# Patient Record
Sex: Female | Born: 1990 | Race: Black or African American | Hispanic: No | Marital: Single | Smoking: Never smoker
Health system: Southern US, Community
[De-identification: ages and names within clinical notes are randomized; demographics above are authoritative.]

---
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
## 2012-04-08 ENCOUNTER — Ambulatory Visit: Payer: Self-pay | Admitting: Family Medicine

## 2012-04-09 ENCOUNTER — Ambulatory Visit: Payer: Self-pay | Admitting: Family Medicine

## 2012-08-05 ENCOUNTER — Ambulatory Visit: Payer: Self-pay | Admitting: Family Medicine

## 2013-04-10 ENCOUNTER — Ambulatory Visit: Payer: Self-pay | Admitting: Internal Medicine

## 2013-04-11 ENCOUNTER — Ambulatory Visit: Payer: Self-pay | Admitting: Family Medicine

## 2013-08-01 ENCOUNTER — Ambulatory Visit: Payer: Self-pay | Admitting: Family Medicine

## 2013-10-03 ENCOUNTER — Ambulatory Visit: Payer: Self-pay | Admitting: Family Medicine

## 2013-10-25 ENCOUNTER — Ambulatory Visit: Payer: Self-pay | Admitting: Family Medicine

## 2014-04-03 ENCOUNTER — Ambulatory Visit: Payer: Self-pay | Admitting: Family Medicine

## 2014-07-10 ENCOUNTER — Ambulatory Visit: Payer: Self-pay | Admitting: Family Medicine

## 2015-03-22 DIAGNOSIS — N946 Dysmenorrhea, unspecified: Secondary | ICD-10-CM | POA: Diagnosis not present

## 2015-07-05 IMAGING — CR RIGHT THUMB 2+V
1 series · 3 of 3 positions shown · non-contrast
Comparison: None.

CLINICAL DATA: Injured right thumb in basketball practice 1 week
ago. Pain and swelling .

EXAM:
RIGHT THUMB 2+V

[Series 1: kdxr thumb right hand (1st digit · 0.14mm/px · 3 of 3 slices shown]
[im 1/3]
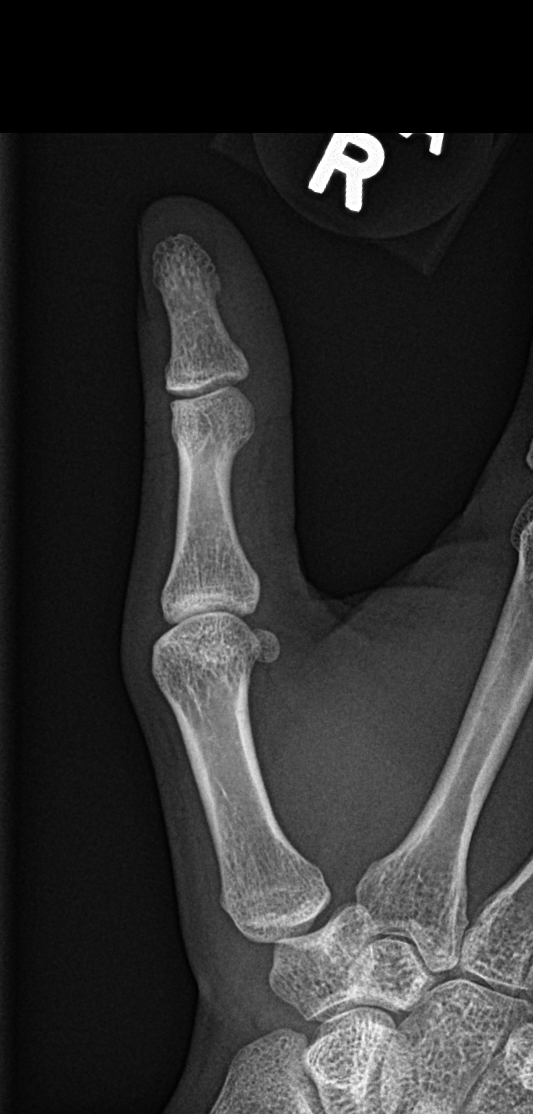
[im 2/3]
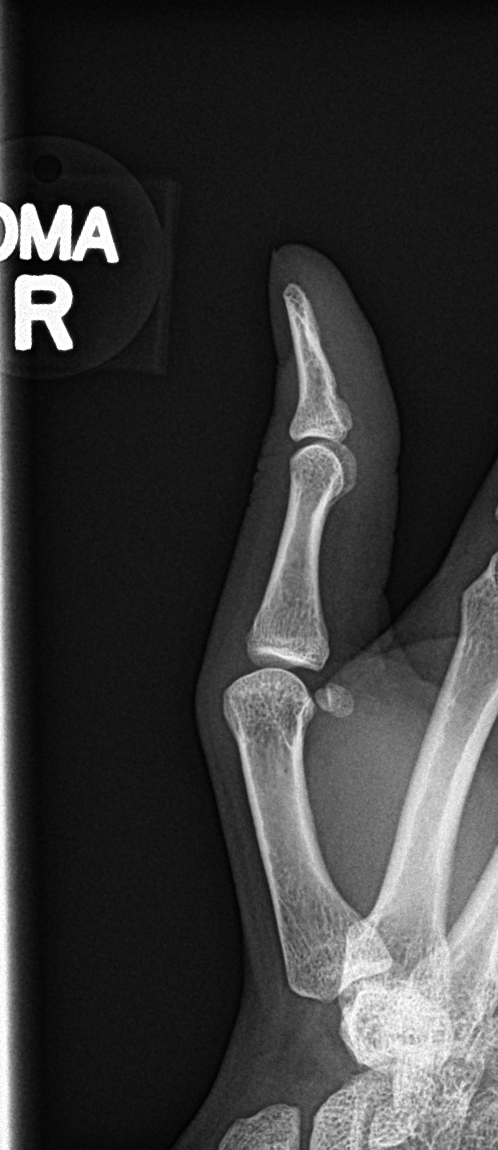
[im 3/3]
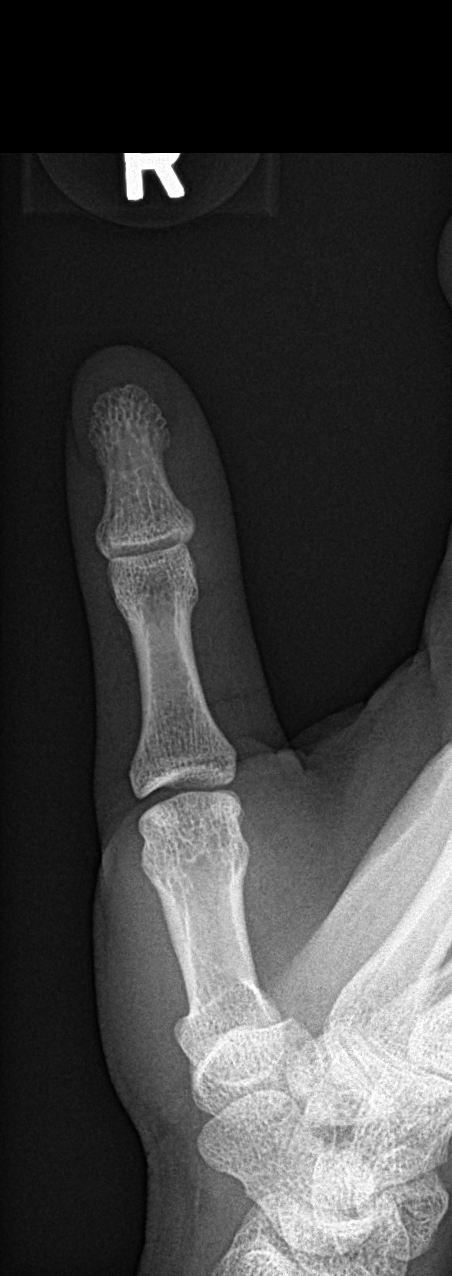

[3 of 3 positions shown; findings below may reference images not displayed]

FINDINGS: No acute bony or joint abnormality. No evidence of fracture or
dislocation.
IMPRESSION: No acute abnormality.

## 2021-04-29 DIAGNOSIS — Z3041 Encounter for surveillance of contraceptive pills: Secondary | ICD-10-CM | POA: Diagnosis not present

## 2021-04-29 DIAGNOSIS — J309 Allergic rhinitis, unspecified: Secondary | ICD-10-CM | POA: Insufficient documentation

## 2021-07-15 DIAGNOSIS — Z13220 Encounter for screening for lipoid disorders: Secondary | ICD-10-CM | POA: Diagnosis not present

## 2021-07-15 DIAGNOSIS — Z0000 Encounter for general adult medical examination without abnormal findings: Secondary | ICD-10-CM | POA: Diagnosis not present

## 2021-07-15 DIAGNOSIS — Z113 Encounter for screening for infections with a predominantly sexual mode of transmission: Secondary | ICD-10-CM | POA: Diagnosis not present

## 2021-07-15 DIAGNOSIS — Z130 Encounter for screening for diseases of the blood and blood-forming organs and certain disorders involving the immune mechanism: Secondary | ICD-10-CM | POA: Diagnosis not present

## 2021-07-15 DIAGNOSIS — Z131 Encounter for screening for diabetes mellitus: Secondary | ICD-10-CM | POA: Diagnosis not present

## 2021-07-15 LAB — HIV ANTIBODY (ROUTINE TESTING W REFLEX): HIV 1&2 Ab, 4th Generation: NEGATIVE

## 2021-07-15 LAB — HM PAP SMEAR: HM Pap smear: NEGATIVE

## 2021-07-15 LAB — HM HEPATITIS C SCREENING LAB: HM Hepatitis Screen: NEGATIVE

## 2021-09-20 ENCOUNTER — Ambulatory Visit: Payer: Self-pay | Admitting: Nurse Practitioner

## 2021-09-20 ENCOUNTER — Encounter: Payer: Self-pay | Admitting: Nurse Practitioner

## 2021-09-20 VITALS — BP 110/73 | HR 98 | Temp 97.7°F | Resp 19

## 2021-09-20 DIAGNOSIS — J069 Acute upper respiratory infection, unspecified: Secondary | ICD-10-CM

## 2021-09-20 DIAGNOSIS — R5081 Fever presenting with conditions classified elsewhere: Secondary | ICD-10-CM

## 2021-09-20 LAB — POC COVID19 BINAXNOW: SARS Coronavirus 2 Ag: NEGATIVE

## 2021-09-20 LAB — POCT INFLUENZA A/B
Influenza A, POC: NEGATIVE
Influenza B, POC: NEGATIVE

## 2021-09-20 NOTE — Progress Notes (Signed)
? ?  Subjective:  ? ? Patient ID: Jackie Mills, female    DOB: September 26, 1990, 31 y.o.   MRN: [id number] ? ?HPI ? ?31 year old female presenting to [facility] with complaints of body aches and chills that onset yesterday. She also feels congestion in her chest today.  ? ?She has taken tylenol and used Mucinex yesterday  ? 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  ? ?Denies a history of asthma  ? ?Today's Vitals  ? 09/20/21 1037  ?BP: 110/73  ?Pulse: 98  ?Resp: 19  ?Temp: 97.7 ?F (36.5 ?C)  ?SpO2: 99%  ? ?There is no height or weight on file to calculate BMI.  ? ?Review of Systems  ?Constitutional:  Positive for chills and fever.  ?HENT:  Positive for congestion, rhinorrhea and sore throat.   ?Eyes: Negative.   ?Respiratory:  Positive for cough.   ?Cardiovascular: Negative.   ?Genitourinary: Negative.   ?Musculoskeletal:  Positive for myalgias.  ?Skin: Negative.   ?Neurological: Negative.   ? ?   ?Objective:  ? Physical Exam ?Constitutional:   ?   Appearance: She is ill-appearing.  ?HENT:  ?   Head: Normocephalic.  ?   Comments: PND noted ?   Right Ear: Tympanic membrane, ear canal and external ear normal.  ?   Left Ear: Tympanic membrane, ear canal and external ear normal.  ?   Nose: Congestion present.  ?   Mouth/Throat:  ?   Mouth: Mucous membranes are moist.  ?   Pharynx: Posterior oropharyngeal erythema present.  ?Eyes:  ?   Pupils: Pupils are equal, round, and reactive to light.  ?Cardiovascular:  ?   Rate and Rhythm: Normal rate and regular rhythm.  ?   Heart sounds: Normal heart sounds.  ?Pulmonary:  ?   Effort: Pulmonary effort is normal.  ?   Breath sounds: Normal breath sounds.  ?Musculoskeletal:  ?   Cervical back: Normal range of motion.  ?Skin: ?   General: Skin is warm.  ?   Capillary Refill: Capillary refill takes less than 2 seconds.  ?Neurological:  ?   General: No focal  deficit present.  ?   Mental Status: She is alert.  ?Psychiatric:     ?   Mood and Affect: Mood normal.  ? ? ? ?Recent Results (from the past 2160 hour(s))  ?POC COVID-19     Status: Normal  ? Collection Time: 09/20/21 10:36 AM  ?Result Value Ref Range  ? SARS Coronavirus 2 Ag Negative Negative  ?POCT Influenza A/B     Status: Normal  ? Collection Time: 09/20/21 10:36 AM  ?Result Value Ref Range  ? Influenza A, POC Negative Negative  ? Influenza B, POC Negative Negative  ?  ? ? ?   ?Assessment & Plan:  ?1. Fever in other diseases ? ?- POC COVID-19 negative  ?- POCT Influenza A/B negative  ? ?2. Viral URI with cough ?Continue OTC management of symptoms  ?Reviewed contagious status until 24 hours without fever and without tylenol ? ?Push fluids rest  ?RTC if symptoms persist or with new concerns as discussed  ?   ? ?

## 2021-09-27 ENCOUNTER — Ambulatory Visit: Payer: Self-pay | Admitting: Nurse Practitioner

## 2021-09-27 ENCOUNTER — Encounter: Payer: Self-pay | Admitting: Nurse Practitioner

## 2021-09-27 VITALS — BP 98/57 | HR 87 | Temp 98.1°F | Resp 20

## 2021-09-27 DIAGNOSIS — J40 Bronchitis, not specified as acute or chronic: Secondary | ICD-10-CM

## 2021-09-27 MED ORDER — AZITHROMYCIN 250 MG PO TABS: ORAL_TABLET | ORAL | 0 refills | Status: AC

## 2021-09-27 MED ORDER — ALBUTEROL SULFATE HFA 108 (90 BASE) MCG/ACT IN AERS: 2.0000 | INHALATION_SPRAY | Freq: Four times a day (QID) | RESPIRATORY_TRACT | 0 refills | Status: AC | PRN

## 2021-09-27 NOTE — Progress Notes (Signed)
? ?  Subjective:  ? ? Patient ID: Jackie Mills, female    DOB: 1991-04-22, 31 y.o.   MRN: [id number] ? ?HPI ? ?31 year old female returning to [facility] with complaints of cough that has been present since the onset of her URI last week. She was seen in the clinic one week ago with congestion fever and chills. She had negative COVID and FLU testing at that time. Her nasal congestion has improved, but she now has a productive cough with tightness in her chest. She denies a history of asthma, but she has had bronchitis that has required inhaler use in the past.  ? ?Today's Vitals  ? 09/27/21 0922  ?BP: (!) 98/57  ?Pulse: 87  ?Resp: 20  ?Temp: 98.1 ?F (36.7 ?C)  ?TempSrc: Tympanic  ?SpO2: 97%  ? ?There is no height or weight on file to calculate BMI.  ? ? ?Review of Systems  ?Constitutional:  Positive for fatigue.  ?HENT:  Positive for postnasal drip.   ?Eyes: Negative.   ?Respiratory:  Positive for cough and wheezing.   ?Cardiovascular: Negative.   ?Genitourinary: Negative.   ?Musculoskeletal: Negative.   ?Neurological: Negative.   ?Psychiatric/Behavioral: Negative.    ? ?   ?Objective:  ? Physical Exam ?HENT:  ?   Head: Normocephalic.  ?   Right Ear: Tympanic membrane, ear canal and external ear normal.  ?   Left Ear: Tympanic membrane, ear canal and external ear normal.  ?   Nose: Nose normal.  ?   Mouth/Throat:  ?   Mouth: Mucous membranes are moist.  ?Eyes:  ?   Pupils: Pupils are equal, round, and reactive to light.  ?Cardiovascular:  ?   Rate and Rhythm: Normal rate and regular rhythm.  ?   Heart sounds: Normal heart sounds.  ?Pulmonary:  ?   Breath sounds: Examination of the right-upper field reveals wheezing. Examination of the left-upper field reveals wheezing. Wheezing present.  ?Musculoskeletal:     ?   General: Normal range of motion.  ?   Cervical back: Normal range of motion.  ?Skin: ?   General: Skin is warm.  ?   Capillary Refill: Capillary refill takes less than 2 seconds.   ?Neurological:  ?   General: No focal deficit present.  ?   Mental Status: She is alert.  ?Psychiatric:     ?   Mood and Affect: Mood normal.  ? ? ? ? ? ?   ?Assessment & Plan:  ?1. Bronchitis ? ?- azithromycin (ZITHROMAX) 250 MG tablet; Take 2 tablets on day 1, then 1 tablet daily on days 2 through 5  Dispense: 6 tablet; Refill: 0 ?- albuterol (VENTOLIN HFA) 108 (90 Base) MCG/ACT inhaler; Inhale 2 puffs into the lungs every 6 (six) hours as needed for wheezing or shortness of breath.  Dispense: 8 g; Refill: 0 ?  Continue Mucinex for support ?Increase fluids  ?Rest  ?Humidifier by bed  ? ?

## 2022-05-14 ENCOUNTER — Encounter: Payer: Self-pay | Admitting: Nurse Practitioner

## 2022-05-14 ENCOUNTER — Ambulatory Visit: Payer: Self-pay | Admitting: Nurse Practitioner

## 2022-05-14 VITALS — BP 112/60 | HR 97 | Temp 98.8°F | Ht 66.0 in | Wt 136.2 lb

## 2022-05-14 DIAGNOSIS — Z3041 Encounter for surveillance of contraceptive pills: Secondary | ICD-10-CM

## 2022-05-14 DIAGNOSIS — J069 Acute upper respiratory infection, unspecified: Secondary | ICD-10-CM

## 2022-05-14 LAB — POC COVID19 BINAXNOW: SARS Coronavirus 2 Ag: NEGATIVE

## 2022-05-14 LAB — POCT INFLUENZA A/B
Influenza A, POC: NEGATIVE
Influenza B, POC: NEGATIVE

## 2022-05-14 MED ORDER — TRI-SPRINTEC 0.18/0.215/0.25 MG-35 MCG PO TABS: 1.0000 | ORAL_TABLET | Freq: Every day | ORAL | 3 refills | Status: DC

## 2022-05-14 NOTE — Progress Notes (Signed)
[facility] [address], [place] [address] [phone]  Office Visit Note  Patient Name: Jackie Mills Date of Birth [birth year]  Medical Record number [id number]  Date of Service: 05/14/2022  Chief Complaint  Patient presents with   Cough    Started Monday. Fatigue, congested, cough, feverish, ST, body aches on Monday. Mucus is yellow     HPI 31 year old female presenting with acute onset of URI symptoms for the past 3 days. She has been taking Mucinex and using Vitamin C for immune support .  She had another URI 2 weeks ago that lasted about a week   She is a Insurance claims handler at [employer]  She travels and is around a lot of students   She has had COVID in the past 3x She has been vaccinated  She has not had a flu vaccine this year   Current Medication:  Outpatient Encounter Medications as of 05/14/2022  Medication Sig   albuterol (VENTOLIN HFA) 108 (90 Base) MCG/ACT inhaler Inhale 2 puffs into the lungs every 6 (six) hours as needed for wheezing or shortness of breath.   TRI-SPRINTEC 0.18/0.215/0.25 MG-35 MCG tablet Take 1 tablet by mouth daily.   No facility-administered encounter medications on file as of 05/14/2022.      Medical History: No past medical history on file.   Vital Signs: BP 112/60 (BP Location: Left Arm, Patient Position: Sitting, Cuff Size: Normal)   Pulse 97   Temp 98.8 F (37.1 C) (Tympanic)   Ht 5\' 6"  (1.676 m)   Wt 136 lb 3.2 oz (61.8 kg)   SpO2 97%   BMI 21.98 kg/m    Review of Systems  Constitutional:  Positive for fatigue and fever.  HENT:  Positive for congestion and sore throat.   Respiratory:  Positive for cough.   Gastrointestinal: Negative.   Genitourinary: Negative.   Musculoskeletal:  Positive for myalgias.  Neurological: Negative.   Psychiatric/Behavioral: Negative.      Physical Exam Constitutional:      Appearance: She is ill-appearing.  HENT:     Head: Normocephalic.     Right Ear:  Tympanic membrane, ear canal and external ear normal.     Left Ear: Tympanic membrane, ear canal and external ear normal.     Nose: Congestion present.     Mouth/Throat:     Pharynx: Posterior oropharyngeal erythema present.  Eyes:     Pupils: Pupils are equal, round, and reactive to light.  Cardiovascular:     Rate and Rhythm: Normal rate and regular rhythm.     Heart sounds: Normal heart sounds.  Pulmonary:     Effort: Pulmonary effort is normal.     Breath sounds: Normal breath sounds.  Musculoskeletal:     Cervical back: Normal range of motion.  Skin:    General: Skin is warm.  Neurological:     General: No focal deficit present.     Mental Status: She is alert and oriented to person, place, and time. Mental status is at baseline.  Psychiatric:        Mood and Affect: Mood normal.     Recent Results (from the past 2160 hour(s))  POCT Influenza A/B     Status: Normal   Collection Time: 05/14/22  4:59 PM  Result Value Ref Range   Influenza A, POC Negative Negative   Influenza B, POC Negative Negative  POC COVID-19     Status: Normal   Collection Time: 05/14/22  5:00 PM  Result Value Ref Range   SARS Coronavirus 2 Ag Negative Negative     Assessment/Plan: 1. Viral URI  - POC COVID-19 - POCT Influenza A/B  2. Encounter for surveillance of contraceptive pills  - TRI-SPRINTEC 0.18/0.215/0.25 MG-35 MCG tablet; Take 1 tablet by mouth daily.  Dispense: 28 tablet; Refill: 3    General Counseling: Jossie verbalizes understanding of the findings of todays visit and agrees with plan of treatment. I have discussed any further diagnostic evaluation that may be needed or ordered today. We also reviewed her medications today. she has been encouraged to call the office with any questions or concerns that should arise related to todays visit.    Time spent:20   Minutes   Viviano Simas [facility] Family Nurse Practitioner

## 2022-06-19 ENCOUNTER — Encounter: Payer: Self-pay | Admitting: Nurse Practitioner

## 2022-06-30 ENCOUNTER — Ambulatory Visit: Payer: Self-pay | Admitting: Internal Medicine

## 2022-08-04 ENCOUNTER — Encounter: Payer: Self-pay | Admitting: Internal Medicine

## 2022-08-04 ENCOUNTER — Ambulatory Visit (INDEPENDENT_AMBULATORY_CARE_PROVIDER_SITE_OTHER): Payer: BC Managed Care – PPO | Admitting: Internal Medicine

## 2022-08-04 VITALS — BP 116/62 | HR 79 | Temp 98.0°F | Resp 16 | Ht 65.0 in | Wt 136.7 lb

## 2022-08-04 DIAGNOSIS — U071 COVID-19: Secondary | ICD-10-CM | POA: Diagnosis not present

## 2022-08-04 DIAGNOSIS — Z3041 Encounter for surveillance of contraceptive pills: Secondary | ICD-10-CM

## 2022-08-04 MED ORDER — TRI-SPRINTEC 0.18/0.215/0.25 MG-35 MCG PO TABS: 1.0000 | ORAL_TABLET | Freq: Every day | ORAL | 11 refills | Status: DC

## 2022-08-04 NOTE — Patient Instructions (Signed)
It was great seeing you today!  Plan discussed at today's visit: -Blood work ordered today, results will be uploaded to [facility].  -Medication refilled  Follow up in: 1 year or sooner as needed  Take care and let us know if you have any questions or concerns prior to your next visit.  Dr. Rosana Berger

## 2022-08-04 NOTE — Progress Notes (Signed)
New Patient Office Visit  Subjective    Patient ID: Jackie Mills, female    DOB: 03/18/1991  Age: 32 y.o. MRN: [id number]  CC:  Chief Complaint  Patient presents with   Establish Care    Patient states she get sick a lot     HPI Jared G Yepez presents to establish care.  Contraception/Menorrhagia: -Currently on Tri-Sprintec OCP -Has been on for years, doing well, no issues.   COVID-19: -Symptoms started 2/11, tested positive 2/15 -Symptoms improving, mainly having nasal congestion now, taking decongestants   Health Maintenance: -Blood work due -Pap 1/23 negative   Outpatient Encounter Medications as of 08/04/2022  Medication Sig   albuterol (VENTOLIN HFA) 108 (90 Base) MCG/ACT inhaler Inhale 2 puffs into the lungs every 6 (six) hours as needed for wheezing or shortness of breath.   TRI-SPRINTEC 0.18/0.215/0.25 MG-35 MCG tablet Take 1 tablet by mouth daily.   No facility-administered encounter medications on file as of 08/04/2022.    History reviewed. No pertinent past medical history.  History reviewed. No pertinent surgical history.  History reviewed. No pertinent family history.  Social History   Socioeconomic History   Marital status: Single    Spouse name: Not on file   Number of children: Not on file   Years of education: Not on file   Highest education level: Not on file  Occupational History   Not on file  Tobacco Use   Smoking status: Never   Smokeless tobacco: Never  Vaping Use   Vaping Use: Never used  Substance and Sexual Activity   Alcohol use: Yes   Drug use: Never   Sexual activity: Yes  Other Topics Concern   Not on file  Social History Narrative   Not on file   Social Determinants of Health   Financial Resource Strain: Not on file  Food Insecurity: Not on file  Transportation Needs: Not on file  Physical Activity: Not on file  Stress: Not on file  Social Connections: Not on file  Intimate Partner Violence: Not on file     Review of Systems  All other systems reviewed and are negative.       Objective    BP 116/62   Pulse 79   Temp 98 F (36.7 C)   Resp 16   Ht 5' 5"$  (1.651 m)   Wt 136 lb 11.2 oz (62 kg)   LMP 07/22/2022   SpO2 99%   BMI 22.75 kg/m   Physical Exam Constitutional:      Appearance: Normal appearance.  HENT:     Head: Normocephalic and atraumatic.     Right Ear: Tympanic membrane, ear canal and external ear normal.     Left Ear: Tympanic membrane, ear canal and external ear normal.     Nose: Congestion present.     Mouth/Throat:     Mouth: Mucous membranes are moist.     Pharynx: Oropharynx is clear.  Eyes:     Conjunctiva/sclera: Conjunctivae normal.  Neck:     Comments: No thyromegaly  Cardiovascular:     Rate and Rhythm: Normal rate and regular rhythm.  Pulmonary:     Effort: Pulmonary effort is normal.     Breath sounds: Normal breath sounds.  Musculoskeletal:     Cervical back: No tenderness.     Right lower leg: No edema.     Left lower leg: No edema.  Lymphadenopathy:     Cervical: No cervical adenopathy.  Skin:  General: Skin is warm and dry.  Neurological:     General: No focal deficit present.     Mental Status: She is alert. Mental status is at baseline.  Psychiatric:        Mood and Affect: Mood normal.        Behavior: Behavior normal.         Assessment & Plan:   1. COVID-19: Symptoms resolving, will obtain CBC, CMP today.   - CBC w/Diff/Platelet - COMPLETE METABOLIC PANEL WITH GFR  2. Encounter for surveillance of contraceptive pills: Doing well, has been on for awhile. Refill OCP's for 1 year.   - TRI-SPRINTEC 0.18/0.215/0.25 MG-35 MCG tablet; Take 1 tablet by mouth daily.  Dispense: 28 tablet; Refill: 11  Return in 1 year (on 08/05/2023).   Teodora Medici, DO

## 2022-08-05 LAB — COMPLETE METABOLIC PANEL WITH GFR
AG Ratio: 1.2 (calc) (ref 1.0–2.5)
ALT: 11 U/L (ref 6–29)
AST: 18 U/L (ref 10–30)
Albumin: 3.7 g/dL (ref 3.6–5.1)
Alkaline phosphatase (APISO): 40 U/L (ref 31–125)
BUN: 9 mg/dL (ref 7–25)
CO2: 26 mmol/L (ref 20–32)
Calcium: 8.9 mg/dL (ref 8.6–10.2)
Chloride: 107 mmol/L (ref 98–110)
Creat: 0.78 mg/dL (ref 0.50–0.96)
Globulin: 3 g/dL (calc) (ref 1.9–3.7)
Glucose, Bld: 82 mg/dL (ref 65–99)
Potassium: 4.7 mmol/L (ref 3.5–5.3)
Sodium: 141 mmol/L (ref 135–146)
Total Bilirubin: 0.6 mg/dL (ref 0.2–1.2)
Total Protein: 6.7 g/dL (ref 6.1–8.1)
eGFR: 105 mL/min/{1.73_m2} (ref >=60)

## 2022-08-05 LAB — CBC WITH DIFFERENTIAL/PLATELET
Absolute Monocytes: 279 cells/uL (ref 200–950)
Basophils Absolute: 20 cells/uL (ref 0–200)
Basophils Relative: 0.4 %
Eosinophils Absolute: 88 cells/uL (ref 15–500)
Eosinophils Relative: 1.8 %
HCT: 38.5 % (ref 35.0–45.0)
Hemoglobin: 13 g/dL (ref 11.7–15.5)
Lymphs Abs: 2234 cells/uL (ref 850–3900)
MCH: 29.4 pg (ref 27.0–33.0)
MCHC: 33.8 g/dL (ref 32.0–36.0)
MCV: 87.1 fL (ref 80.0–100.0)
MPV: 9.5 fL (ref 7.5–12.5)
Monocytes Relative: 5.7 %
Neutro Abs: 2279 cells/uL (ref 1500–7800)
Neutrophils Relative %: 46.5 %
Platelets: 403 10*3/uL — ABNORMAL HIGH (ref 140–400)
RBC: 4.42 10*6/uL (ref 3.80–5.10)
RDW: 12 % (ref 11.0–15.0)
Total Lymphocyte: 45.6 %
WBC: 4.9 10*3/uL (ref 3.8–10.8)

## 2023-08-05 ENCOUNTER — Other Ambulatory Visit: Payer: Self-pay | Admitting: Internal Medicine

## 2023-08-05 DIAGNOSIS — Z3041 Encounter for surveillance of contraceptive pills: Secondary | ICD-10-CM

## 2023-08-06 ENCOUNTER — Ambulatory Visit: Payer: Self-pay | Admitting: Internal Medicine

## 2023-08-31 ENCOUNTER — Ambulatory Visit (INDEPENDENT_AMBULATORY_CARE_PROVIDER_SITE_OTHER): Payer: Self-pay | Admitting: Internal Medicine

## 2023-08-31 ENCOUNTER — Encounter: Payer: Self-pay | Admitting: Internal Medicine

## 2023-08-31 ENCOUNTER — Other Ambulatory Visit: Payer: Self-pay

## 2023-08-31 VITALS — BP 110/70 | HR 85 | Temp 98.3°F | Resp 16 | Ht 65.0 in | Wt 146.6 lb

## 2023-08-31 DIAGNOSIS — Z3041 Encounter for surveillance of contraceptive pills: Secondary | ICD-10-CM

## 2023-08-31 DIAGNOSIS — Z0000 Encounter for general adult medical examination without abnormal findings: Secondary | ICD-10-CM

## 2023-08-31 DIAGNOSIS — Z13220 Encounter for screening for lipoid disorders: Secondary | ICD-10-CM | POA: Diagnosis not present

## 2023-08-31 MED ORDER — NORGESTIM-ETH ESTRAD TRIPHASIC 0.18/0.215/0.25 MG-35 MCG PO TABS: 1.0000 | ORAL_TABLET | Freq: Every day | ORAL | 12 refills | Status: AC

## 2023-08-31 NOTE — Progress Notes (Signed)
 Name: Jackie Mills   MRN: [id number]    DOB: 1990/09/29   Date:08/31/2023       Progress Note  Subjective  Chief Complaint  Chief Complaint  Patient presents with   Annual Exam    HPI  Patient presents for annual CPE.  Diet: Regular Exercise: 2 times a week 45 minutes - going to the gym Last Eye Exam: will schedule Last Dental Exam: completed  Flowsheet Row Office Visit from 08/31/2023 in [facility]  AUDIT-C Score 1      Depression: Phq 9 is  negative    08/31/2023   10:09 AM 08/04/2022    8:35 AM  Depression screen PHQ 2/9  Decreased Interest 0 0  Down, Depressed, Hopeless 0 0  PHQ - 2 Score 0 0  Altered sleeping  0  Tired, decreased energy  0  Change in appetite  0  Feeling bad or failure about yourself   0  Trouble concentrating  0  Moving slowly or fidgety/restless  0  Suicidal thoughts  0  PHQ-9 Score  0  Difficult doing work/chores  Not difficult at all   Hypertension: BP Readings from Last 3 Encounters:  08/31/23 110/70  08/04/22 116/62  05/14/22 112/60   Obesity: Wt Readings from Last 3 Encounters:  08/31/23 146 lb 9.6 oz (66.5 kg)  08/04/22 136 lb 11.2 oz (62 kg)  05/14/22 136 lb 3.2 oz (61.8 kg)   BMI Readings from Last 3 Encounters:  08/31/23 24.40 kg/m  08/04/22 22.75 kg/m  05/14/22 21.98 kg/m     Vaccines: HPV vaccines reviewed with the patient. Patient believes she had them in childhood.   Hep C Screening: completed STD testing and prevention (HIV/chl/gon/syphilis): no concerns  Intimate partner violence: negative screen  LMP: 08/07/2023 LMP, on triphasic OCP's, overall periods are much better  Discussed importance of follow up if any post-menopausal bleeding: not applicable  Incontinence Symptoms: negative for symptoms   Breast cancer:  - Last Mammogram: Discussed starting screening at age 55  Osteoporosis Prevention : Discussed high calcium and vitamin D supplementation, weight bearing  exercises Bone density :not applicable   Cervical cancer screening: up-to-date 1/23  Skin cancer: Discussed monitoring for atypical lesions  Colorectal cancer: Discussed starting screening at age 5  Lung cancer:  Low Dose CT Chest recommended if Age 34-80 years, 20 pack-year currently smoking OR have quit w/in 15years. Patient does not qualify for screen    Advanced Care Planning: A voluntary discussion about advance care planning including the explanation and discussion of advance directives.  Discussed health care proxy and Living will, and the patient was able to identify a health care proxy as Jackie Mills (mother).  Patient does not have a living will and power of attorney of health care   Patient Active Problem List   Diagnosis Date Noted   Allergic rhinitis 04/29/2021    No past surgical history on file.  No family history on file.  Social History   Socioeconomic History   Marital status: Single    Spouse name: Not on file   Number of children: Not on file   Years of education: Not on file   Highest education level: Not on file  Occupational History   Not on file  Tobacco Use   Smoking status: Never   Smokeless tobacco: Never  Vaping Use   Vaping status: Never Used  Substance and Sexual Activity   Alcohol use: Yes   Drug use: Never  Sexual activity: Yes  Other Topics Concern   Not on file  Social History Narrative   Not on file   Social Drivers of Health   Financial Resource Strain: Low Risk  (08/31/2023)   Overall Financial Resource Strain (CARDIA)    Difficulty of Paying Living Expenses: Not hard at all  Food Insecurity: No Food Insecurity (08/31/2023)   Hunger Vital Sign    Worried About Running Out of Food in the Last Year: Never true    Ran Out of Food in the Last Year: Never true  Transportation Needs: No Transportation Needs (08/31/2023)   PRAPARE - Administrator, Civil Service (Medical): No    Lack of Transportation (Non-Medical):  No  Physical Activity: Insufficiently Active (08/31/2023)   Exercise Vital Sign    Days of Exercise per Week: 2 days    Minutes of Exercise per Session: 40 min  Stress: No Stress Concern Present (08/31/2023)   [employer] of Occupational Health - Occupational Stress Questionnaire    Feeling of Stress : Only a little  Social Connections: Moderately Isolated (08/31/2023)   Social Connection and Isolation Panel [NHANES]    Frequency of Communication with Friends and Family: More than three times a week    Frequency of Social Gatherings with Friends and Family: More than three times a week    Attends Religious Services: 1 to 4 times per year    Active Member of [employer] or Organizations: No    Attends Banker Meetings: Never    Marital Status: Never married  Intimate Partner Violence: Not At Risk (08/31/2023)   Humiliation, Afraid, Rape, and Kick questionnaire    Fear of Current or Ex-Partner: No    Emotionally Abused: No    Physically Abused: No    Sexually Abused: No     Current Outpatient Medications:    albuterol (VENTOLIN HFA) 108 (90 Base) MCG/ACT inhaler, Inhale 2 puffs into the lungs every 6 (six) hours as needed for wheezing or shortness of breath., Disp: 8 g, Rfl: 0   Norgestimate-Ethinyl Estradiol Triphasic (TRI-SPRINTEC) 0.18/0.215/0.25 MG-35 MCG tablet, TAKE 1 TABLET BY MOUTH DAILY, Disp: 28 tablet, Rfl: 0  No Known Allergies   Review of Systems  All other systems reviewed and are negative.   Objective  Vitals:   08/31/23 1005  BP: 110/70  Pulse: 85  Resp: 16  Temp: 98.3 F (36.8 C)  TempSrc: Oral  SpO2: 99%  Weight: 146 lb 9.6 oz (66.5 kg)  Height: 5\' 5"  (1.651 m)    Body mass index is 24.4 kg/m.  Physical Exam Constitutional:      Appearance: Normal appearance.  HENT:     Head: Normocephalic and atraumatic.     Mouth/Throat:     Mouth: Mucous membranes are moist.     Pharynx: Oropharynx is clear.  Eyes:     Extraocular Movements:  Extraocular movements intact.     Conjunctiva/sclera: Conjunctivae normal.     Pupils: Pupils are equal, round, and reactive to light.  Neck:     Comments: No thyromegaly Cardiovascular:     Rate and Rhythm: Normal rate and regular rhythm.  Pulmonary:     Effort: Pulmonary effort is normal.     Breath sounds: Normal breath sounds.  Musculoskeletal:     Cervical back: No tenderness.     Right lower leg: No edema.     Left lower leg: No edema.  Lymphadenopathy:     Cervical: No cervical adenopathy.  Skin:    General: Skin is warm and dry.  Neurological:     General: No focal deficit present.     Mental Status: She is alert. Mental status is at baseline.  Psychiatric:        Mood and Affect: Mood normal.        Behavior: Behavior normal.     Last CBC Lab Results  Component Value Date   WBC 4.9 08/04/2022   HGB 13.0 08/04/2022   HCT 38.5 08/04/2022   MCV 87.1 08/04/2022   MCH 29.4 08/04/2022   RDW 12.0 08/04/2022   PLT 403 (H) 08/04/2022   Last metabolic panel Lab Results  Component Value Date   GLUCOSE 82 08/04/2022   NA 141 08/04/2022   K 4.7 08/04/2022   CL 107 08/04/2022   CO2 26 08/04/2022   BUN 9 08/04/2022   CREATININE 0.78 08/04/2022   EGFR 105 08/04/2022   CALCIUM 8.9 08/04/2022   PROT 6.7 08/04/2022   BILITOT 0.6 08/04/2022   AST 18 08/04/2022   ALT 11 08/04/2022   Last lipids No results found for: "CHOL", "HDL", "LDLCALC", "LDLDIRECT", "TRIG", "CHOLHDL" Last hemoglobin A1c No results found for: "HGBA1C" Last thyroid functions No results found for: "TSH", "T3TOTAL", "T4TOTAL", "THYROIDAB" Last vitamin D No results found for: "25OHVITD2", "25OHVITD3", "VD25OH" Last vitamin B12 and Folate No results found for: "VITAMINB12", "FOLATE"    Assessment & Plan  1. Annual physical exam (Primary)/Lipid screening: Physical exam completed, health maintenance reviewed and annual labs ordered.   - CBC w/Diff/Platelet - COMPLETE METABOLIC PANEL WITH  GFR - Lipid Profile  2. Encounter for surveillance of contraceptive pills: OCP's refilled.   - Norgestimate-Ethinyl Estradiol Triphasic (TRI-SPRINTEC) 0.18/0.215/0.25 MG-35 MCG tablet; Take 1 tablet by mouth daily.  Dispense: 28 tablet; Refill: 12    -USPSTF grade A and B recommendations reviewed with patient; age-appropriate recommendations, preventive care, screening tests, etc discussed and encouraged; healthy living encouraged; see AVS for patient education given to patient -Discussed importance of 150 minutes of physical activity weekly, eat two servings of fish weekly, eat one serving of tree nuts ( cashews, pistachios, pecans, almonds.Marland Kitchen) every other day, eat 6 servings of fruit/vegetables daily and drink plenty of water and avoid sweet beverages.   -Reviewed Health Maintenance: Yes.

## 2023-09-01 ENCOUNTER — Encounter: Payer: Self-pay | Admitting: Internal Medicine

## 2023-09-01 LAB — CBC WITH DIFFERENTIAL/PLATELET
Absolute Lymphocytes: 1907 {cells}/uL (ref 850–3900)
Absolute Monocytes: 337 {cells}/uL (ref 200–950)
Basophils Absolute: 20 {cells}/uL (ref 0–200)
Basophils Relative: 0.4 %
Eosinophils Absolute: 51 {cells}/uL (ref 15–500)
Eosinophils Relative: 1 %
HCT: 39.1 % (ref 35.0–45.0)
Hemoglobin: 12.8 g/dL (ref 11.7–15.5)
MCH: 29.7 pg (ref 27.0–33.0)
MCHC: 32.7 g/dL (ref 32.0–36.0)
MCV: 90.7 fL (ref 80.0–100.0)
MPV: 9.6 fL (ref 7.5–12.5)
Monocytes Relative: 6.6 %
Neutro Abs: 2785 {cells}/uL (ref 1500–7800)
Neutrophils Relative %: 54.6 %
Platelets: 391 10*3/uL (ref 140–400)
RBC: 4.31 10*6/uL (ref 3.80–5.10)
RDW: 11.9 % (ref 11.0–15.0)
Total Lymphocyte: 37.4 %
WBC: 5.1 10*3/uL (ref 3.8–10.8)

## 2023-09-01 LAB — COMPLETE METABOLIC PANEL WITH GFR
AG Ratio: 1.4 (calc) (ref 1.0–2.5)
ALT: 10 U/L (ref 6–29)
AST: 13 U/L (ref 10–30)
Albumin: 3.8 g/dL (ref 3.6–5.1)
Alkaline phosphatase (APISO): 39 U/L (ref 31–125)
BUN: 13 mg/dL (ref 7–25)
CO2: 27 mmol/L (ref 20–32)
Calcium: 8.9 mg/dL (ref 8.6–10.2)
Chloride: 106 mmol/L (ref 98–110)
Creat: 0.77 mg/dL (ref 0.50–0.97)
Globulin: 2.7 g/dL (ref 1.9–3.7)
Glucose, Bld: 85 mg/dL (ref 65–99)
Potassium: 4.6 mmol/L (ref 3.5–5.3)
Sodium: 140 mmol/L (ref 135–146)
Total Bilirubin: 0.4 mg/dL (ref 0.2–1.2)
Total Protein: 6.5 g/dL (ref 6.1–8.1)
eGFR: 106 mL/min/{1.73_m2} (ref >=60)

## 2023-09-01 LAB — LIPID PANEL
Cholesterol: 183 mg/dL (ref <200)
HDL: 61 mg/dL (ref >=50)
LDL Cholesterol (Calc): 107 mg/dL — ABNORMAL HIGH
Non-HDL Cholesterol (Calc): 122 mg/dL (ref <130)
Total CHOL/HDL Ratio: 3 (calc) (ref <5.0)
Triglycerides: 67 mg/dL (ref <150)

## 2024-03-30 ENCOUNTER — Other Ambulatory Visit: Payer: Self-pay

## 2024-03-30 ENCOUNTER — Encounter: Payer: Self-pay | Admitting: Adult Health

## 2024-03-30 ENCOUNTER — Ambulatory Visit: Payer: Self-pay | Admitting: Adult Health

## 2024-03-30 VITALS — BP 110/70 | HR 88 | Temp 98.8°F | Ht 65.0 in | Wt 145.0 lb

## 2024-03-30 DIAGNOSIS — R1110 Vomiting, unspecified: Secondary | ICD-10-CM

## 2024-03-30 DIAGNOSIS — R197 Diarrhea, unspecified: Secondary | ICD-10-CM

## 2024-03-30 NOTE — Progress Notes (Signed)
 Therapist, [facility] [address] [place], [place] [address]   Office Visit Note  Patient Name: Jackie Mills Date of Birth [birth year]  Medical Record number [id number]  Date of Service: 03/30/2024  Chief Complaint  Patient presents with   Emesis    Patient states she became nauseous 2-3 days ago and then began vomiting and had diarrhea yesterday. 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   Diarrhea     Emesis  Associated symptoms include diarrhea. Pertinent negatives include no abdominal pain, arthralgias, chest pain, coughing, dizziness, fever or headaches.  Diarrhea  Associated symptoms include vomiting. Pertinent negatives include no abdominal pain, arthralgias, coughing, fever or headaches.   Pt is here for a sick visit. She reports 2 days ago she started feeling queasy in the afternoon.  She did manage to have burger for dinner.  Yesterday morning she woke up around 6 am had some vomiting/diarrhea.  This persisted through the day, and then went away completely around 5pm.  Today she has eaten dry cereal, and has had water,and feels improvement.  No vomiting or diarrhea today.    Current Medication:  Outpatient Encounter Medications as of 03/30/2024  Medication Sig   albuterol  (VENTOLIN  HFA) 108 (90 Base) MCG/ACT inhaler Inhale 2 puffs into the lungs every 6 (six) hours as needed for wheezing or shortness of breath.   Norgestimate-Ethinyl Estradiol Triphasic (TRI-SPRINTEC ) 0.18/0.215/0.25 MG-35 MCG tablet Take 1 tablet by mouth daily.   No facility-administered encounter medications on file as of 03/30/2024.      Medical History: History reviewed. No pertinent past medical history.   Vital Signs: BP 110/70   Pulse 88   Temp 98.8 F (37.1 C)   Ht 5' 5 (1.651 m)   Wt 145 lb (65.8 kg)   SpO2 97%   BMI 24.13 kg/m    Review of Systems  Constitutional:  Negative for  activity change, appetite change and fever.  HENT:  Negative for congestion, postnasal drip, sinus pain and sore throat.   Eyes:  Negative for pain, discharge and itching.  Respiratory:  Negative for cough and shortness of breath.   Cardiovascular:  Negative for chest pain, palpitations and leg swelling.  Gastrointestinal:  Positive for diarrhea and vomiting. Negative for abdominal distention and abdominal pain.  Endocrine: Negative for cold intolerance and heat intolerance.  Genitourinary:  Negative for difficulty urinating and flank pain.  Musculoskeletal:  Negative for arthralgias and joint swelling.  Skin:  Negative for color change and rash.  Neurological:  Negative for dizziness and headaches.  Hematological:  Negative for adenopathy.  Psychiatric/Behavioral:  Negative for agitation and confusion.     Physical Exam Vitals reviewed.  Constitutional:      Appearance: She is normal weight.  Eyes:     General:        Right eye: No discharge.        Left eye: No discharge.     Pupils: Pupils are equal, round, and reactive to light.  Neurological:     Mental Status: She is alert.  Psychiatric:        Mood and Affect: Mood normal.        Behavior: Behavior normal.     Assessment/Plan: 1. Vomiting and diarrhea (Primary) Continue to advance diet as tolerated. Stay hydrated. Follow up via MyChart messenger if symptoms fail to improve or may return to  clinic as needed for worsening symptoms.       General Counseling: Jackie Mills verbalizes understanding of the findings of todays visit and agrees with plan of treatment. I have discussed any further diagnostic evaluation that may be needed or ordered today. We also reviewed her medications today. she has been encouraged to call the office with any questions or concerns that should arise related to todays visit.   No orders of the defined types were placed in this encounter.   No orders of the defined types were placed in this  encounter.   Time spent:15 Minutes    Juliene DOROTHA Howells AGNP-C Nurse Practitioner
